# Patient Record
Sex: Female | Born: 1970 | Race: White | Hispanic: No | Marital: Married | Smoking: Never smoker
Health system: Southern US, Community
[De-identification: ages and names within clinical notes are randomized; demographics above are authoritative.]

## PROBLEM LIST (undated history)

## (undated) DIAGNOSIS — E079 Disorder of thyroid, unspecified: Secondary | ICD-10-CM

---

## 2011-06-25 ENCOUNTER — Ambulatory Visit: Payer: Self-pay

## 2012-11-07 ENCOUNTER — Ambulatory Visit: Payer: Self-pay

## 2016-08-14 ENCOUNTER — Ambulatory Visit
Admission: EM | Admit: 2016-08-14 | Discharge: 2016-08-14 | Disposition: A | Discharge status: Home / Self Care | Payer: BC Managed Care – PPO | Attending: Family Medicine | Admitting: Family Medicine

## 2016-08-14 ENCOUNTER — Encounter: Payer: Self-pay | Admitting: *Deleted

## 2016-08-14 ENCOUNTER — Ambulatory Visit (INDEPENDENT_AMBULATORY_CARE_PROVIDER_SITE_OTHER): Payer: BC Managed Care – PPO

## 2016-08-14 DIAGNOSIS — Z841 Family history of disorders of kidney and ureter: Secondary | ICD-10-CM | POA: Insufficient documentation

## 2016-08-14 DIAGNOSIS — Z823 Family history of stroke: Secondary | ICD-10-CM | POA: Insufficient documentation

## 2016-08-14 DIAGNOSIS — E669 Obesity, unspecified: Secondary | ICD-10-CM | POA: Diagnosis not present

## 2016-08-14 DIAGNOSIS — E079 Disorder of thyroid, unspecified: Secondary | ICD-10-CM | POA: Diagnosis not present

## 2016-08-14 DIAGNOSIS — Z6836 Body mass index (BMI) 36.0-36.9, adult: Secondary | ICD-10-CM | POA: Insufficient documentation

## 2016-08-14 DIAGNOSIS — R0789 Other chest pain: Secondary | ICD-10-CM

## 2016-08-14 DIAGNOSIS — M549 Dorsalgia, unspecified: Secondary | ICD-10-CM | POA: Insufficient documentation

## 2016-08-14 DIAGNOSIS — R079 Chest pain, unspecified: Secondary | ICD-10-CM | POA: Diagnosis present

## 2016-08-14 HISTORY — DX: Disorder of thyroid, unspecified: E07.9

## 2016-08-14 LAB — CBC WITH DIFFERENTIAL/PLATELET
Basophils Absolute: 0.1 10*3/uL (ref 0–0.1)
Basophils Relative: 1 %
EOS PCT: 5 %
Eosinophils Absolute: 0.5 10*3/uL (ref 0–0.7)
HEMATOCRIT: 45.2 % (ref 35.0–47.0)
Hemoglobin: 15.1 g/dL (ref 12.0–16.0)
LYMPHS ABS: 3.9 10*3/uL — AB (ref 1.0–3.6)
LYMPHS PCT: 34 %
MCH: 28.4 pg (ref 26.0–34.0)
MCHC: 33.4 g/dL (ref 32.0–36.0)
MCV: 85.1 fL (ref 80.0–100.0)
MONO ABS: 0.8 10*3/uL (ref 0.2–0.9)
MONOS PCT: 7 %
Neutro Abs: 6.2 10*3/uL (ref 1.4–6.5)
Neutrophils Relative %: 53 %
PLATELETS: 337 10*3/uL (ref 150–440)
RBC: 5.31 MIL/uL — ABNORMAL HIGH (ref 3.80–5.20)
RDW: 13.7 % (ref 11.5–14.5)
WBC: 11.5 10*3/uL — ABNORMAL HIGH (ref 3.6–11.0)

## 2016-08-14 LAB — COMPREHENSIVE METABOLIC PANEL
ALBUMIN: 4.5 g/dL (ref 3.5–5.0)
ALT: 31 U/L (ref 14–54)
AST: 24 U/L (ref 15–41)
Alkaline Phosphatase: 69 U/L (ref 38–126)
Anion gap: 9 (ref 5–15)
BILIRUBIN TOTAL: 0.6 mg/dL (ref 0.3–1.2)
BUN: 11 mg/dL (ref 6–20)
CHLORIDE: 101 mmol/L (ref 101–111)
CO2: 23 mmol/L (ref 22–32)
Calcium: 9.4 mg/dL (ref 8.9–10.3)
Creatinine, Ser: 0.91 mg/dL (ref 0.44–1.00)
GFR calc Af Amer: >60 mL/min (ref >60)
GFR calc non Af Amer: >60 mL/min (ref >60)
GLUCOSE: 93 mg/dL (ref 65–99)
POTASSIUM: 3.9 mmol/L (ref 3.5–5.1)
SODIUM: 133 mmol/L — AB (ref 135–145)
TOTAL PROTEIN: 7.9 g/dL (ref 6.5–8.1)

## 2016-08-14 LAB — FIBRIN DERIVATIVES D-DIMER (ARMC ONLY): Fibrin derivatives D-dimer (ARMC): 168 (ref 0–499)

## 2016-08-14 NOTE — ED Triage Notes (Signed)
Patient started having symptom of left side thoracic back pain yesterday that is now radiating to her left side chest starting this AM. No previous history of left side chest pain.

## 2016-08-14 NOTE — ED Provider Notes (Signed)
[facility]    CSN: [id number] Arrival date & time: 08/14/16  1039     History   Chief Complaint Chief Complaint  Patient presents with  . Chest Pain  . Back Pain    HPI Kristina Villa is a 46 y.o. female.   The history is provided by the patient.  Chest Pain  Pain location:  L lateral chest (states yesterday felt like muscular pain and spasm to her left latera chest and left mid back area but today feels slightly different) Pain quality: aching   Pain radiates to:  Does not radiate Pain severity:  Mild Onset quality:  Sudden Duration:  12 hours Timing:  Intermittent Chronicity:  New Context: not breathing, not drug use, not eating, not intercourse, not lifting, not movement, not raising an arm, not at rest, not stress and not trauma   Relieved by:  None tried Ineffective treatments:  None tried Associated symptoms: back pain   Associated symptoms: no abdominal pain, no AICD problem, no altered mental status, no anorexia, no anxiety, no claudication, no cough, no diaphoresis, no dizziness, no dysphagia, no fatigue, no fever, no headache, no heartburn, no lower extremity edema, no nausea, no near-syncope, no numbness, no orthopnea, no PND, no shortness of breath, no syncope, no vomiting and no weakness   Risk factors: immobilization (at work sits for hours) and obesity   Risk factors: no aortic disease, no birth control, no coronary artery disease, no diabetes mellitus, no Ehlers-Danlos syndrome, no high cholesterol, no hypertension, not female, no Marfan's syndrome, not pregnant, no prior DVT/PE, no smoking and no surgery     Past Medical History:  Diagnosis Date  . Thyroid disease     There are no active problems to display for this patient.   History reviewed. No pertinent surgical history.  OB History    No data available       Home Medications    Prior to Admission medications   Medication Sig Start Date End Date Taking? Authorizing Provider    phentermine 37.5 MG capsule Take 37.5 mg by mouth every morning.   Yes Historical Provider, MD  THYROID PO Take 2 tablets by mouth daily.   Yes Historical Provider, MD    Family History Family History  Problem Relation Age of Onset  . Stroke Father   . Kidney disease Father     Social History Social History  Substance Use Topics  . Smoking status: Never Smoker  . Smokeless tobacco: Never Used  . Alcohol use Yes     Allergies   Patient has no known allergies.   Review of Systems Review of Systems  Constitutional: Negative for diaphoresis, fatigue and fever.  HENT: Negative for trouble swallowing.   Respiratory: Negative for cough and shortness of breath.   Cardiovascular: Positive for chest pain. Negative for orthopnea, claudication, syncope, PND and near-syncope.  Gastrointestinal: Negative for abdominal pain, anorexia, heartburn, nausea and vomiting.  Musculoskeletal: Positive for back pain.  Neurological: Negative for dizziness, weakness, numbness and headaches.     Physical Exam Triage Vital Signs ED Triage Vitals  Enc Vitals Group     BP 08/14/16 1051 (!) 148/95     Pulse Rate 08/14/16 1051 (!) 120     Resp 08/14/16 1051 18     Temp 08/14/16 1051 98.3 F (36.8 C)     Temp Source 08/14/16 1051 Oral     SpO2 08/14/16 1051 100 %     Weight 08/14/16 1051 218  lb (98.9 kg)     Height 08/14/16 1051 5\' 5"  (1.651 m)     Head Circumference --      Peak Flow --      Pain Score 08/14/16 1056 5     Pain Loc --      Pain Edu? --      Excl. in GC? --    No data found.   Updated Vital Signs BP (!) 148/95 (BP Location: Right Arm)   Pulse 100   Temp 98.3 F (36.8 C) (Oral)   Resp 18   Ht 5\' 5"  (1.651 m)   Wt 218 lb (98.9 kg)   LMP 07/27/2016 (Within Days) Comment: denies preg  SpO2 100%   BMI 36.28 kg/m   Visual Acuity Right Eye Distance:   Left Eye Distance:   Bilateral Distance:    Right Eye Near:   Left Eye Near:    Bilateral Near:     Physical  Exam  Constitutional: She appears well-developed and well-nourished. No distress.  HENT:  Head: Normocephalic and atraumatic.  Right Ear: Tympanic membrane, external ear and ear canal normal.  Left Ear: Tympanic membrane, external ear and ear canal normal.  Nose: No mucosal edema, rhinorrhea, nose lacerations, sinus tenderness, nasal deformity, septal deviation or nasal septal hematoma. No epistaxis.  No foreign bodies. Right sinus exhibits no maxillary sinus tenderness and no frontal sinus tenderness. Left sinus exhibits no maxillary sinus tenderness and no frontal sinus tenderness.  Mouth/Throat: Uvula is midline, oropharynx is clear and moist and mucous membranes are normal. No oropharyngeal exudate.  Eyes: Conjunctivae and EOM are normal. Pupils are equal, round, and reactive to light. Right eye exhibits no discharge. Left eye exhibits no discharge. No scleral icterus.  Neck: Normal range of motion. Neck supple. No thyromegaly present.  Cardiovascular: Normal rate, regular rhythm, normal heart sounds and intact distal pulses.   Pulmonary/Chest: Effort normal and breath sounds normal. No respiratory distress. She has no wheezes. She has no rales. She exhibits tenderness (mild tenderness reproducible to left lateral chest area).  Abdominal: Soft. Bowel sounds are normal. She exhibits no distension. There is no tenderness.  Lymphadenopathy:    She has no cervical adenopathy.  Skin: She is not diaphoretic.  Nursing note and vitals reviewed.    UC Treatments / Results  Labs (all labs ordered are listed, but only abnormal results are displayed) Labs Reviewed  COMPREHENSIVE METABOLIC PANEL - Abnormal; Notable for the following:       Result Value   Sodium 133 (*)    All other components within normal limits  CBC WITH DIFFERENTIAL/PLATELET - Abnormal; Notable for the following:    WBC 11.5 (*)    RBC 5.31 (*)    Lymphs Abs 3.9 (*)    All other components within normal limits  FIBRIN  DERIVATIVES D-DIMER [facility] ONLY)    EKG  EKG Interpretation None       Radiology Dg Chest 2 View  Result Date: 08/14/2016 CLINICAL DATA:  Chest pain EXAM: CHEST  2 VIEW COMPARISON:  None. FINDINGS: Heart and mediastinal contours are within normal limits. No focal opacities or effusions. No acute bony abnormality. IMPRESSION: No active cardiopulmonary disease. Electronically Signed   By: Charlett Nose M.D.   On: 08/14/2016 11:41    Procedures ED EKG Date/Time: 08/14/2016 1:14 PM Performed by: Payton Mccallum Authorized by: Payton Mccallum   ECG reviewed by ED Physician in the absence of a cardiologist: yes   Previous ECG:  Previous ECG:  Unavailable Interpretation:    Interpretation: normal   Rate:    ECG rate assessment: tachycardic   Rhythm:    Rhythm: sinus tachycardia   Ectopy:    Ectopy: none   QRS:    QRS axis:  Normal Conduction:    Conduction: normal   ST segments:    ST segments:  Normal T waves:    T waves: normal     (including critical care time)  Medications Ordered in UC Medications - No data to display   Initial Impression / Assessment and Plan / UC Course  I have reviewed the triage vital signs and the nursing notes.  Pertinent labs & imaging results that were available during my care of the patient were reviewed by me and considered in my medical decision making (see chart for details).       Final Clinical Impressions(s) / UC Diagnoses   Final diagnoses:  Other chest pain  (likely muscular)  New Prescriptions Discharge Medication List as of 08/14/2016  1:12 PM     1. Labs/x-ray/ekg results and diagnosis reviewed with patient 2. Recommend supportive treatment with rest, otc analgesics prn 3. Follow-up prn if symptoms worsen or don't improve   Payton Mccallumrlando Wilmer Berryhill, MD 08/14/16 1316

## 2019-05-04 ENCOUNTER — Ambulatory Visit: Payer: Self-pay | Admitting: Nurse Practitioner

## 2019-09-08 ENCOUNTER — Other Ambulatory Visit: Payer: BC Managed Care – PPO

## 2021-06-21 ENCOUNTER — Other Ambulatory Visit: Payer: Self-pay | Admitting: Neurology

## 2021-06-21 DIAGNOSIS — R413 Other amnesia: Secondary | ICD-10-CM

## 2021-07-06 ENCOUNTER — Other Ambulatory Visit: Payer: Self-pay

## 2021-07-06 ENCOUNTER — Ambulatory Visit
Admission: RE | Admit: 2021-07-06 | Discharge: 2021-07-06 | Disposition: A | Discharge status: Home / Self Care | Payer: BC Managed Care – PPO | Source: Ambulatory Visit | Attending: Neurology | Admitting: Neurology

## 2021-07-06 DIAGNOSIS — R413 Other amnesia: Secondary | ICD-10-CM | POA: Insufficient documentation

## 2021-07-06 MED ORDER — GADOBUTROL 1 MMOL/ML IV SOLN
10.0000 mL | Freq: Once | INTRAVENOUS | Status: AC | PRN
  Administered 2021-07-06: 09:00:00 10 mL via INTRAVENOUS

## 2023-05-10 IMAGING — MR MR HEAD WO/W CM
16 series · 48 of 48 positions shown · IV contrast (10ml Gadavist)
Comparison: None.

CLINICAL DATA: Short-term memory loss.  Family history of CADASIL.

EXAM:
MRI HEAD WITHOUT AND WITH CONTRAST
TECHNIQUE: Multiplanar, multiecho pulse sequences of the brain and surrounding
structures were obtained without and with intravenous contrast.
CONTRAST:  10mL GADAVIST GADOBUTROL 1 MMOL/ML IV SOLN

[Series 5: ax dwi_tracew · axial · 3.0mm · 0.65mm/px · z∈[-94,+61]mm · 3 of 48 slices shown]
[im 1/48]
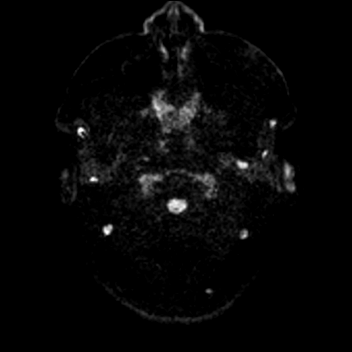
[im 24/48]
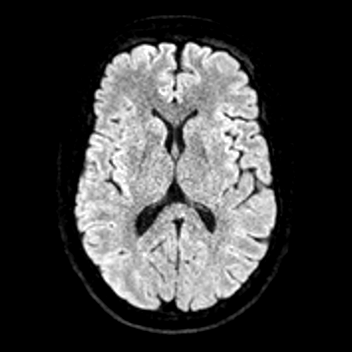
[im 48/48]
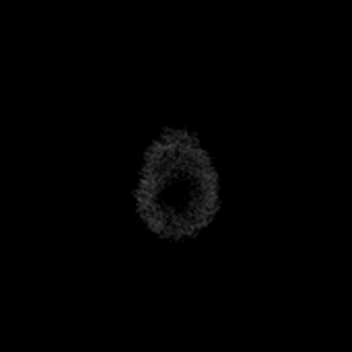

[Series 6: ax dwi_adc · axial · 3.0mm · 0.65mm/px · z∈[-94,+61]mm · 3 of 48 slices shown]
[im 1/48]
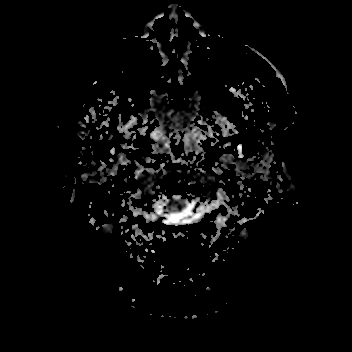
[im 24/48]
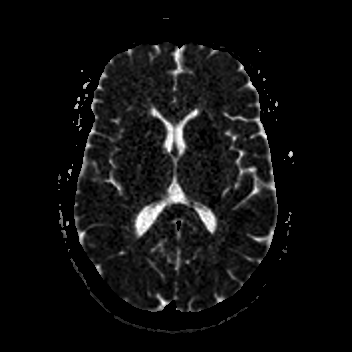
[im 48/48]
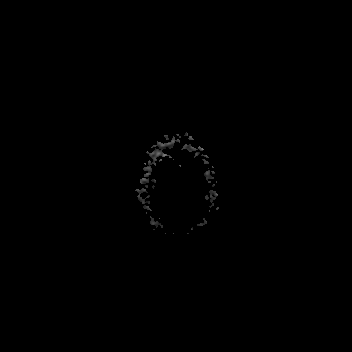

[Series 7: cor dwi_tracew · coronal · 5.0mm · 0.65mm/px · 2 of 40 slices shown]
[im 1/40]
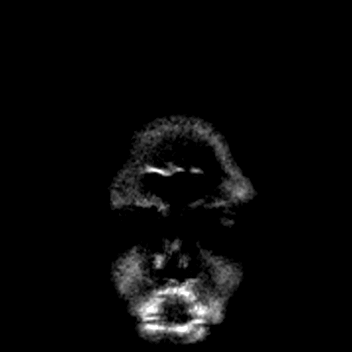
[im 40/40]
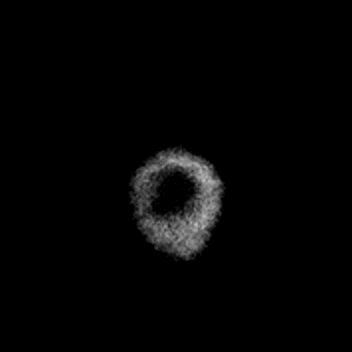

[Series 8: cor dwi_adc · coronal · 5.0mm · 0.65mm/px · 2 of 40 slices shown]
[im 1/40]
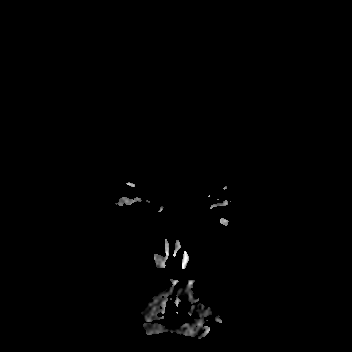
[im 40/40]
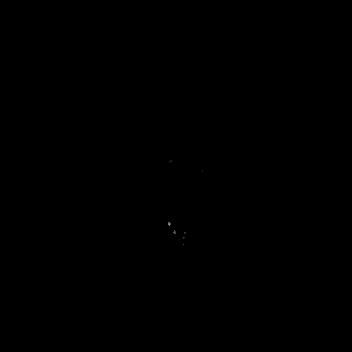

[Series 9: T1 · sagittal · 5.0mm · 0.62mm/px · 1 of 21 slices shown (1 of 2)]
[im 1/21]
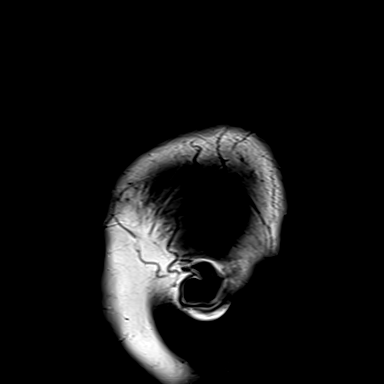

[Series 10: T2 · axial · 5.0mm · 0.53mm/px · 1 of 28 slices shown]
[im 1/28]
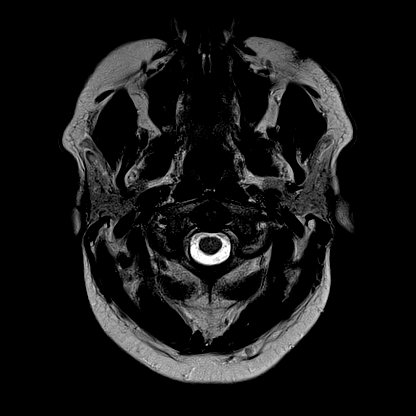

[Series 11: mag_images · axial · 3.0mm · 0.90mm/px · z∈[-104,+73]mm · 3 of 60 slices shown]
[im 1/60]
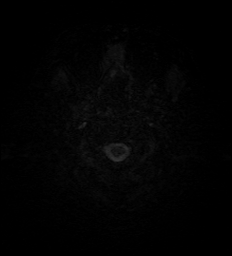
[im 30/60]
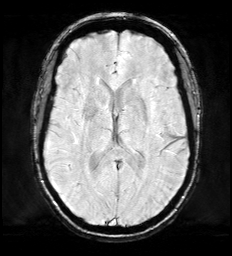
[im 60/60]
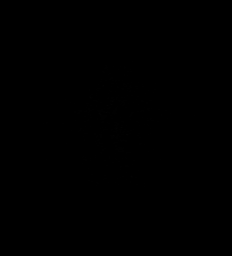

[Series 12: pha_images · axial · 3.0mm · 0.90mm/px · z∈[-104,+73]mm · 3 of 59 slices shown]
[im 1/59]
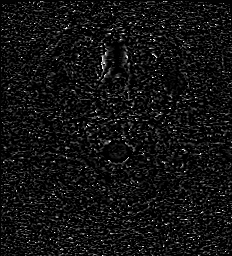
[im 30/59]
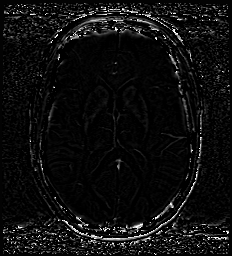
[im 59/59]
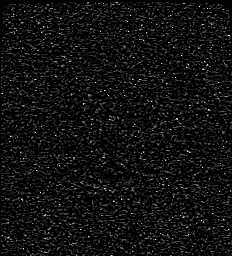

[Series 13: swi_images · axial · 3.0mm · 0.90mm/px · z∈[-104,+73]mm · 3 of 60 slices shown]
[im 1/60]
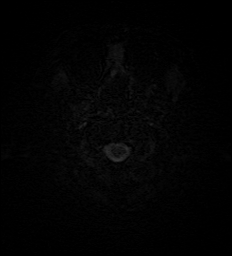
[im 30/60]
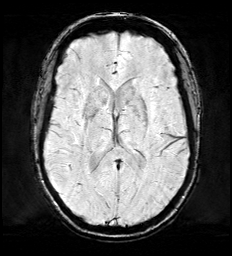
[im 60/60]
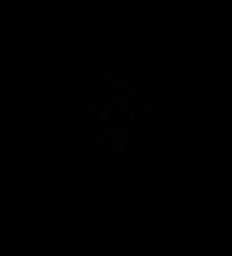

[Series 14: mip_images(sw) · axial · 24.0mm · 0.90mm/px · z∈[-94,+62]mm · 3 of 53 slices shown]
[im 1/53]
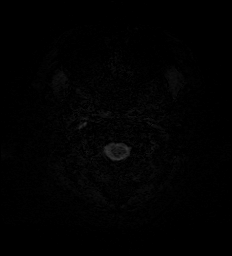
[im 27/53]
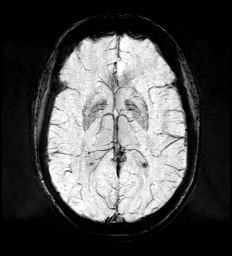
[im 53/53]
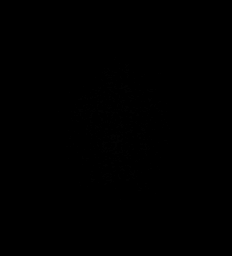

[Series 15: FLAIR · axial · 3.0mm · 0.53mm/px · z∈[-97,+65]mm · 3 of 55 slices shown]
[im 1/55]
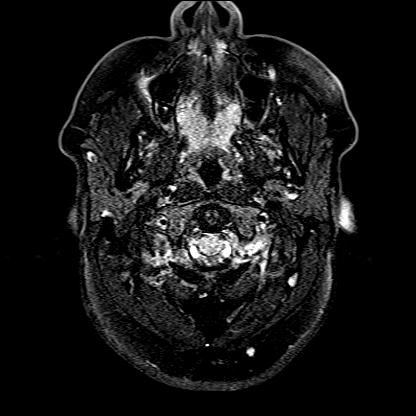
[im 28/55]
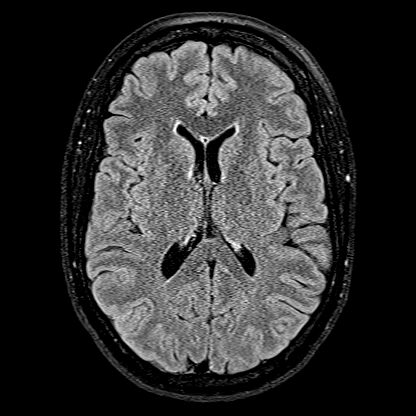
[im 55/55]
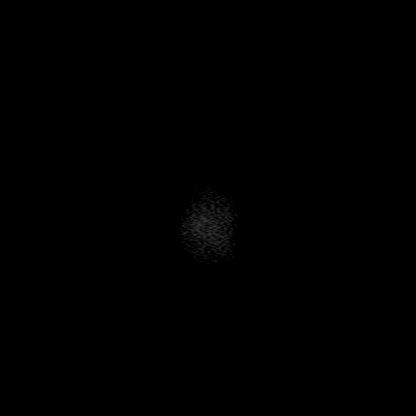

[Series 16: T1 · axial · 1.0mm · 0.98mm/px · z∈[-104,+71]mm · 9 of 176 slices shown (2 of 2)]
[im 1/176]
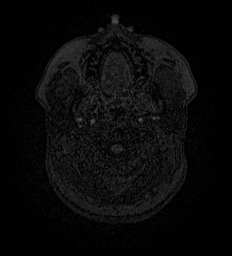
[im 22/176]
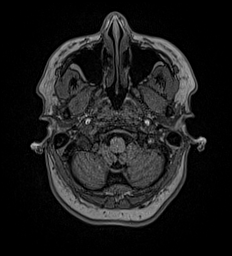
[im 44/176]
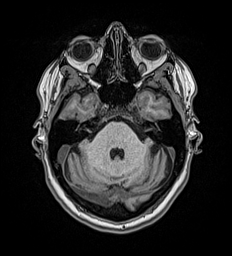
[im 66/176]
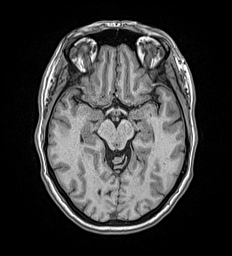
[im 88/176]
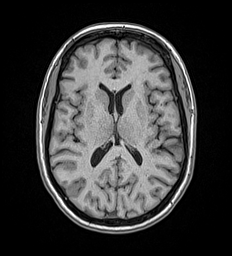
[im 110/176]
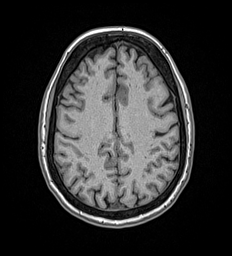
[im 132/176]
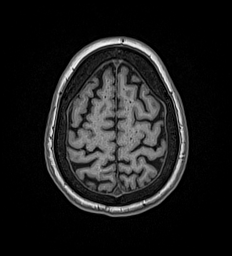
[im 154/176]
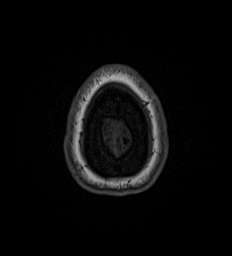
[im 176/176]
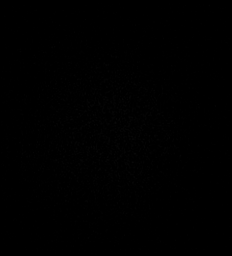

[Series 17: T2 post-contrast · coronal · 5.0mm · 0.57mm/px · 1 of 29 slices shown]
[im 1/29]
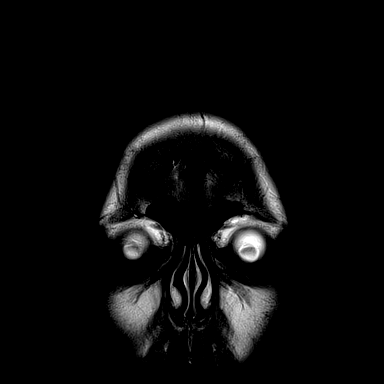

[Series 18: T1 post-contrast · axial · 1.0mm · 0.98mm/px · z∈[-104,+71]mm · 9 of 176 slices shown (1 of 3)]
[im 1/176]
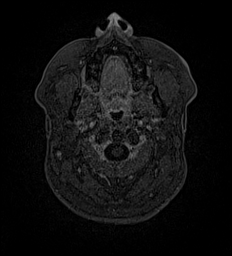
[im 22/176]
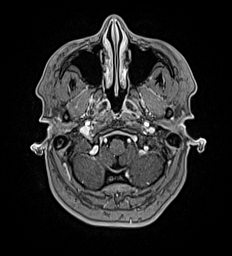
[im 44/176]
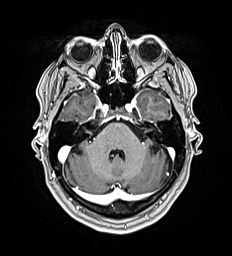
[im 66/176]
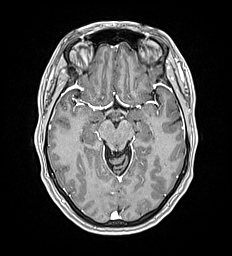
[im 88/176]
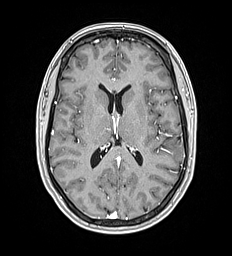
[im 110/176]
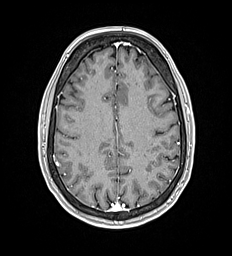
[im 132/176]
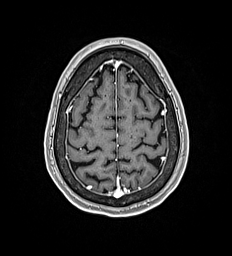
[im 154/176]
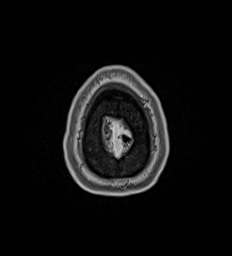
[im 176/176]
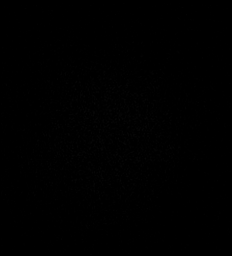

[Series 19: T1 post-contrast · coronal · 5.0mm · 0.57mm/px · 1 of 29 slices shown (2 of 3)]
[im 1/29]
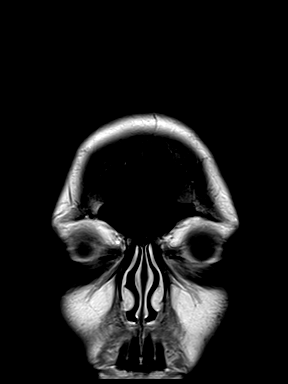

[Series 20: T1 post-contrast · sagittal · 5.0mm · 0.62mm/px · 1 of 21 slices shown (3 of 3)]
[im 1/21]
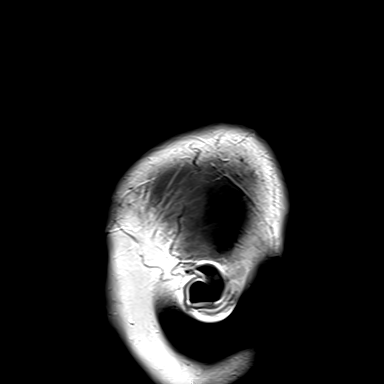

[48 of 48 positions shown; findings below may reference images not displayed]

FINDINGS: Brain: No acute infarct, hemorrhage, or mass lesion is present. No
significant white matter lesions are present. The ventricles are of
normal size. No significant extraaxial fluid collection is present.
The internal auditory canals are within normal limits. The brainstem
and cerebellum are within normal limits.

Postcontrast images demonstrate no pathologic enhancement.

Vascular: Flow is present in the major intracranial arteries.

Skull and upper cervical spine: The craniocervical junction is
normal. Upper cervical spine is within normal limits. Marrow signal
is unremarkable.

Sinuses/Orbits: The paranasal sinuses and mastoid air cells are
clear. The globes and orbits are within normal limits.
IMPRESSION: Negative MRI of the brain. No acute or focal lesion to explain the
patient's symptoms. No significant white matter disease.
